# Patient Record
Sex: Male | Born: 1950 | Race: White | Hispanic: No | Marital: Married | Smoking: Never smoker
Health system: Southern US, Community
[De-identification: ages and names within clinical notes are randomized; demographics above are authoritative.]

## PROBLEM LIST (undated history)

## (undated) DIAGNOSIS — I10 Essential (primary) hypertension: Secondary | ICD-10-CM

## (undated) DIAGNOSIS — I4891 Unspecified atrial fibrillation: Secondary | ICD-10-CM

## (undated) DIAGNOSIS — Z950 Presence of cardiac pacemaker: Secondary | ICD-10-CM

## (undated) DIAGNOSIS — E785 Hyperlipidemia, unspecified: Secondary | ICD-10-CM

## (undated) DIAGNOSIS — M7662 Achilles tendinitis, left leg: Secondary | ICD-10-CM

## (undated) DIAGNOSIS — E119 Type 2 diabetes mellitus without complications: Secondary | ICD-10-CM

## (undated) DIAGNOSIS — G4730 Sleep apnea, unspecified: Secondary | ICD-10-CM

## (undated) DIAGNOSIS — G709 Myoneural disorder, unspecified: Secondary | ICD-10-CM

## (undated) DIAGNOSIS — Z9889 Other specified postprocedural states: Secondary | ICD-10-CM

## (undated) DIAGNOSIS — M7661 Achilles tendinitis, right leg: Secondary | ICD-10-CM

## (undated) HISTORY — PX: CERVICAL LAMINECTOMY: SHX94

## (undated) HISTORY — PX: ULNAR NERVE TRANSPOSITION: SHX2595

## (undated) HISTORY — PX: PACEMAKER IMPLANT: EP1218

---
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
## 2016-08-16 HISTORY — PX: AV NODE ABLATION: EP1193

## 2019-05-17 HISTORY — PX: HERNIA REPAIR: SHX51

## 2019-10-11 ENCOUNTER — Other Ambulatory Visit (HOSPITAL_COMMUNITY): Payer: Self-pay | Admitting: Orthopedic Surgery

## 2019-10-11 DIAGNOSIS — R2231 Localized swelling, mass and lump, right upper limb: Secondary | ICD-10-CM

## 2019-11-01 ENCOUNTER — Ambulatory Visit (HOSPITAL_COMMUNITY)
Admission: RE | Admit: 2019-11-01 | Discharge: 2019-11-01 | Disposition: A | Discharge status: Home / Self Care | Payer: Medicare Other | Source: Ambulatory Visit | Attending: Orthopedic Surgery | Admitting: Orthopedic Surgery

## 2019-11-01 ENCOUNTER — Other Ambulatory Visit: Payer: Self-pay

## 2019-11-01 DIAGNOSIS — R2231 Localized swelling, mass and lump, right upper limb: Secondary | ICD-10-CM | POA: Diagnosis present

## 2019-11-01 MED ORDER — GADOBUTROL 1 MMOL/ML IV SOLN
10.0000 mL | Freq: Once | INTRAVENOUS | Status: AC | PRN
  Administered 2019-11-01: 10 mL via INTRAVENOUS

## 2019-11-08 ENCOUNTER — Other Ambulatory Visit: Payer: Self-pay | Admitting: Orthopedic Surgery

## 2019-11-26 ENCOUNTER — Encounter (HOSPITAL_BASED_OUTPATIENT_CLINIC_OR_DEPARTMENT_OTHER): Payer: Self-pay | Admitting: Orthopedic Surgery

## 2019-11-26 ENCOUNTER — Other Ambulatory Visit: Payer: Self-pay

## 2019-11-28 NOTE — Progress Notes (Signed)
Pre surgery call completed.  Will need pacemaker recommendations and hold time on Eliquis prior to surgery. Called and left a message for Helmut Muster at Dr. Merrilee Seashore office about above.

## 2019-11-30 NOTE — Progress Notes (Signed)
Rceived call from Dr. Jerrell Mylar nurse, ok to hold Eliquis one day prior to surgery. Pacemaker recommendations attached to paper chart. Patient is aware and verbalized understanding.

## 2019-12-03 ENCOUNTER — Other Ambulatory Visit (HOSPITAL_COMMUNITY)
Admission: RE | Admit: 2019-12-03 | Discharge: 2019-12-03 | Disposition: A | Discharge status: Home / Self Care | Payer: Medicare Other | Source: Ambulatory Visit | Attending: Orthopedic Surgery | Admitting: Orthopedic Surgery

## 2019-12-03 ENCOUNTER — Encounter (HOSPITAL_BASED_OUTPATIENT_CLINIC_OR_DEPARTMENT_OTHER)
Admission: RE | Admit: 2019-12-03 | Discharge: 2019-12-03 | Disposition: A | Discharge status: Home / Self Care | Payer: Medicare Other | Source: Ambulatory Visit | Attending: Orthopedic Surgery | Admitting: Orthopedic Surgery

## 2019-12-03 DIAGNOSIS — Z20822 Contact with and (suspected) exposure to covid-19: Secondary | ICD-10-CM | POA: Insufficient documentation

## 2019-12-03 DIAGNOSIS — Z01812 Encounter for preprocedural laboratory examination: Secondary | ICD-10-CM | POA: Insufficient documentation

## 2019-12-03 LAB — SARS CORONAVIRUS 2 (TAT 6-24 HRS): SARS Coronavirus 2: NEGATIVE

## 2019-12-03 LAB — BASIC METABOLIC PANEL
Anion gap: 10 (ref 5–15)
BUN: 13 mg/dL (ref 8–23)
CO2: 25 mmol/L (ref 22–32)
Calcium: 9 mg/dL (ref 8.9–10.3)
Chloride: 105 mmol/L (ref 98–111)
Creatinine, Ser: 1.22 mg/dL (ref 0.61–1.24)
GFR calc Af Amer: >60 mL/min (ref >60)
GFR calc non Af Amer: >60 mL/min (ref >60)
Glucose, Bld: 113 mg/dL — ABNORMAL HIGH (ref 70–99)
Potassium: 4.2 mmol/L (ref 3.5–5.1)
Sodium: 140 mmol/L (ref 135–145)

## 2019-12-03 NOTE — Progress Notes (Signed)

## 2019-12-06 ENCOUNTER — Encounter (HOSPITAL_BASED_OUTPATIENT_CLINIC_OR_DEPARTMENT_OTHER): Payer: Self-pay | Admitting: Orthopedic Surgery

## 2019-12-06 ENCOUNTER — Ambulatory Visit (HOSPITAL_BASED_OUTPATIENT_CLINIC_OR_DEPARTMENT_OTHER): Payer: Medicare Other | Admitting: Anesthesiology

## 2019-12-06 ENCOUNTER — Ambulatory Visit (HOSPITAL_BASED_OUTPATIENT_CLINIC_OR_DEPARTMENT_OTHER)
Admission: RE | Admit: 2019-12-06 | Discharge: 2019-12-06 | Disposition: A | Discharge status: Home / Self Care | Payer: Medicare Other | Attending: Orthopedic Surgery | Admitting: Orthopedic Surgery

## 2019-12-06 ENCOUNTER — Encounter (HOSPITAL_BASED_OUTPATIENT_CLINIC_OR_DEPARTMENT_OTHER)
Admission: RE | Disposition: A | Discharge status: Home / Self Care | Payer: Self-pay | Source: Home / Self Care | Attending: Orthopedic Surgery

## 2019-12-06 ENCOUNTER — Other Ambulatory Visit: Payer: Self-pay

## 2019-12-06 DIAGNOSIS — E119 Type 2 diabetes mellitus without complications: Secondary | ICD-10-CM | POA: Diagnosis not present

## 2019-12-06 DIAGNOSIS — Z833 Family history of diabetes mellitus: Secondary | ICD-10-CM | POA: Insufficient documentation

## 2019-12-06 DIAGNOSIS — G5621 Lesion of ulnar nerve, right upper limb: Secondary | ICD-10-CM | POA: Insufficient documentation

## 2019-12-06 DIAGNOSIS — Z881 Allergy status to other antibiotic agents status: Secondary | ICD-10-CM | POA: Diagnosis not present

## 2019-12-06 DIAGNOSIS — Z888 Allergy status to other drugs, medicaments and biological substances status: Secondary | ICD-10-CM | POA: Insufficient documentation

## 2019-12-06 DIAGNOSIS — Z880 Allergy status to penicillin: Secondary | ICD-10-CM | POA: Insufficient documentation

## 2019-12-06 DIAGNOSIS — I4891 Unspecified atrial fibrillation: Secondary | ICD-10-CM | POA: Insufficient documentation

## 2019-12-06 DIAGNOSIS — I10 Essential (primary) hypertension: Secondary | ICD-10-CM | POA: Diagnosis not present

## 2019-12-06 DIAGNOSIS — Z950 Presence of cardiac pacemaker: Secondary | ICD-10-CM | POA: Insufficient documentation

## 2019-12-06 HISTORY — DX: Essential (primary) hypertension: I10

## 2019-12-06 HISTORY — DX: Achilles tendinitis, right leg: M76.61

## 2019-12-06 HISTORY — DX: Achilles tendinitis, left leg: M76.62

## 2019-12-06 HISTORY — DX: Presence of cardiac pacemaker: Z95.0

## 2019-12-06 HISTORY — PX: ULNAR NERVE TRANSPOSITION: SHX2595

## 2019-12-06 HISTORY — DX: Type 2 diabetes mellitus without complications: E11.9

## 2019-12-06 HISTORY — DX: Myoneural disorder, unspecified: G70.9

## 2019-12-06 HISTORY — DX: Unspecified atrial fibrillation: I48.91

## 2019-12-06 LAB — GLUCOSE, CAPILLARY
Glucose-Capillary: 88 mg/dL (ref 70–99)
Glucose-Capillary: 96 mg/dL (ref 70–99)

## 2019-12-06 SURGERY — ULNAR NERVE DECOMPRESSION/TRANSPOSITION
Anesthesia: Monitor Anesthesia Care | Site: Elbow | Laterality: Right

## 2019-12-06 MED ORDER — ROPIVACAINE HCL 5 MG/ML IJ SOLN
INTRAMUSCULAR | Status: DC | PRN
  Administered 2019-12-06: 20 mL via PERINEURAL

## 2019-12-06 MED ORDER — CEFAZOLIN SODIUM-DEXTROSE 2-4 GM/100ML-% IV SOLN
2.0000 g | INTRAVENOUS | Status: AC
  Administered 2019-12-06: 2 g via INTRAVENOUS

## 2019-12-06 MED ORDER — FENTANYL CITRATE (PF) 100 MCG/2ML IJ SOLN: 25.0000 ug | INTRAMUSCULAR | Status: DC | PRN

## 2019-12-06 MED ORDER — ACETAMINOPHEN 500 MG PO TABS
ORAL_TABLET | ORAL | Status: AC
  Filled 2019-12-06: qty 2

## 2019-12-06 MED ORDER — TRAMADOL HCL 50 MG PO TABS: 50.0000 mg | ORAL_TABLET | Freq: Four times a day (QID) | ORAL | 0 refills | Status: DC | PRN

## 2019-12-06 MED ORDER — OXYCODONE HCL 5 MG PO TABS
ORAL_TABLET | ORAL | Status: AC
  Filled 2019-12-06: qty 1

## 2019-12-06 MED ORDER — ACETAMINOPHEN 500 MG PO TABS
1000.0000 mg | ORAL_TABLET | Freq: Once | ORAL | Status: AC
  Administered 2019-12-06: 1000 mg via ORAL

## 2019-12-06 MED ORDER — OXYCODONE HCL 5 MG PO TABS
5.0000 mg | ORAL_TABLET | Freq: Once | ORAL | Status: AC
  Administered 2019-12-06: 12:00:00 5 mg via ORAL

## 2019-12-06 MED ORDER — MIDAZOLAM HCL 2 MG/2ML IJ SOLN
1.0000 mg | INTRAMUSCULAR | Status: DC | PRN
  Administered 2019-12-06: 2 mg via INTRAVENOUS

## 2019-12-06 MED ORDER — FENTANYL CITRATE (PF) 100 MCG/2ML IJ SOLN
50.0000 ug | INTRAMUSCULAR | Status: DC | PRN
  Administered 2019-12-06: 50 ug via INTRAVENOUS

## 2019-12-06 MED ORDER — CEFAZOLIN SODIUM-DEXTROSE 2-4 GM/100ML-% IV SOLN
INTRAVENOUS | Status: AC
  Filled 2019-12-06: qty 100

## 2019-12-06 MED ORDER — PROPOFOL 500 MG/50ML IV EMUL
INTRAVENOUS | Status: DC | PRN
  Administered 2019-12-06: 100 ug/kg/min via INTRAVENOUS

## 2019-12-06 MED ORDER — PROPOFOL 500 MG/50ML IV EMUL
INTRAVENOUS | Status: AC
  Filled 2019-12-06: qty 50

## 2019-12-06 MED ORDER — MIDAZOLAM HCL 2 MG/2ML IJ SOLN
INTRAMUSCULAR | Status: AC
  Filled 2019-12-06: qty 2

## 2019-12-06 MED ORDER — LACTATED RINGERS IV SOLN: INTRAVENOUS | Status: DC

## 2019-12-06 MED ORDER — FENTANYL CITRATE (PF) 100 MCG/2ML IJ SOLN
INTRAMUSCULAR | Status: AC
  Filled 2019-12-06: qty 2

## 2019-12-06 MED ORDER — DEXAMETHASONE SODIUM PHOSPHATE 10 MG/ML IJ SOLN
INTRAMUSCULAR | Status: DC | PRN
  Administered 2019-12-06: 5 mg

## 2019-12-06 SURGICAL SUPPLY — 48 items
BLADE MINI RND TIP GREEN BEAV (BLADE) IMPLANT
BLADE SURG 15 STRL LF DISP TIS (BLADE) ×1 IMPLANT
BLADE SURG 15 STRL SS (BLADE) ×1
BNDG COHESIVE 3X5 TAN STRL LF (GAUZE/BANDAGES/DRESSINGS) ×2 IMPLANT
BNDG ESMARK 4X9 LF (GAUZE/BANDAGES/DRESSINGS) ×2 IMPLANT
BNDG GAUZE ELAST 4 BULKY (GAUZE/BANDAGES/DRESSINGS) ×2 IMPLANT
CHLORAPREP W/TINT 26 (MISCELLANEOUS) ×2 IMPLANT
CORD BIPOLAR FORCEPS 12FT (ELECTRODE) ×2 IMPLANT
COVER BACK TABLE 60X90IN (DRAPES) ×2 IMPLANT
COVER MAYO STAND STRL (DRAPES) ×2 IMPLANT
COVER WAND RF STERILE (DRAPES) IMPLANT
CUFF TOURN SGL QUICK 18X3 (MISCELLANEOUS) ×2 IMPLANT
DECANTER SPIKE VIAL GLASS SM (MISCELLANEOUS) IMPLANT
DRAPE EXTREMITY T 121X128X90 (DISPOSABLE) ×2 IMPLANT
DRAPE SURG 17X23 STRL (DRAPES) ×2 IMPLANT
DRSG PAD ABDOMINAL 8X10 ST (GAUZE/BANDAGES/DRESSINGS) ×2 IMPLANT
GAUZE 4X4 16PLY RFD (DISPOSABLE) IMPLANT
GAUZE SPONGE 4X4 12PLY STRL (GAUZE/BANDAGES/DRESSINGS) ×2 IMPLANT
GAUZE XEROFORM 1X8 LF (GAUZE/BANDAGES/DRESSINGS) ×2 IMPLANT
GLOVE BIOGEL PI IND STRL 6.5 (GLOVE) ×2 IMPLANT
GLOVE BIOGEL PI IND STRL 8.5 (GLOVE) ×1 IMPLANT
GLOVE BIOGEL PI INDICATOR 6.5 (GLOVE) ×2
GLOVE BIOGEL PI INDICATOR 8.5 (GLOVE) ×1
GLOVE SURG ORTHO 8.0 STRL STRW (GLOVE) ×2 IMPLANT
GOWN STRL REUS W/ TWL LRG LVL3 (GOWN DISPOSABLE) ×1 IMPLANT
GOWN STRL REUS W/TWL LRG LVL3 (GOWN DISPOSABLE) ×1
GOWN STRL REUS W/TWL XL LVL3 (GOWN DISPOSABLE) ×4 IMPLANT
LOOP VESSEL MAXI BLUE (MISCELLANEOUS) IMPLANT
NEEDLE PRECISIONGLIDE 27X1.5 (NEEDLE) IMPLANT
NS IRRIG 1000ML POUR BTL (IV SOLUTION) ×2 IMPLANT
PAD CAST 3X4 CTTN HI CHSV (CAST SUPPLIES) IMPLANT
PAD CAST 4YDX4 CTTN HI CHSV (CAST SUPPLIES) IMPLANT
PADDING CAST COTTON 3X4 STRL (CAST SUPPLIES)
PADDING CAST COTTON 4X4 STRL (CAST SUPPLIES)
SET BASIN DAY SURGERY F.S. (CUSTOM PROCEDURE TRAY) ×2 IMPLANT
SLEEVE SCD COMPRESS KNEE MED (MISCELLANEOUS) IMPLANT
SLING ARM FOAM STRAP LRG (SOFTGOODS) ×2 IMPLANT
SPLINT PLASTER CAST XFAST 3X15 (CAST SUPPLIES) IMPLANT
SPLINT PLASTER XTRA FASTSET 3X (CAST SUPPLIES)
STOCKINETTE 4X48 STRL (DRAPES) ×2 IMPLANT
SUT ETHILON 4 0 PS 2 18 (SUTURE) ×2 IMPLANT
SUT VIC AB 2-0 SH 27 (SUTURE) ×1
SUT VIC AB 2-0 SH 27XBRD (SUTURE) ×1 IMPLANT
SUT VICRYL 4-0 PS2 18IN ABS (SUTURE) ×2 IMPLANT
SYR BULB EAR ULCER 3OZ GRN STR (SYRINGE) ×2 IMPLANT
SYR CONTROL 10ML LL (SYRINGE) IMPLANT
TOWEL GREEN STERILE FF (TOWEL DISPOSABLE) ×2 IMPLANT
UNDERPAD 30X36 HEAVY ABSORB (UNDERPADS AND DIAPERS) ×2 IMPLANT

## 2019-12-06 NOTE — Anesthesia Postprocedure Evaluation (Signed)
Anesthesia Post Note  Patient: Chad Pratt  Procedure(s) Performed: ULNAR NERVE DECOMPRESSION/ (Right Elbow)     Patient location during evaluation: PACU Anesthesia Type: MAC and Regional Level of consciousness: awake and alert Pain management: pain level controlled Vital Signs Assessment: post-procedure vital signs reviewed and stable Respiratory status: spontaneous breathing, nonlabored ventilation, respiratory function stable and patient connected to nasal cannula oxygen Cardiovascular status: stable and blood pressure returned to baseline Postop Assessment: no apparent nausea or vomiting Anesthetic complications: no    Last Vitals:  Vitals:   12/06/19 1115 12/06/19 1130  BP: (!) 146/81 (!) 159/97  Pulse: 65 (!) 51  Resp: 13 16  Temp:  37.2 C  SpO2: 96% 99%    Last Pain:  Vitals:   12/06/19 1130  TempSrc:   PainSc: 2                  Riva Sesma L Kimberla Driskill

## 2019-12-06 NOTE — Brief Op Note (Signed)
12/06/2019  10:31 AM  PATIENT:  Chad Pratt  69 y.o. male  PRE-OPERATIVE DIAGNOSIS:  CUBITAL TUNNEL RIGHT ELBOW  POST-OPERATIVE DIAGNOSIS:  CUBITAL TUNNEL RIGHT ELBOW  PROCEDURE:  Procedure(s) with comments: ULNAR NERVE DECOMPRESSION/ (Right) - AXILLARY  SURGEON:  Surgeon(s) and Role:    * Cindee Salt, MD - Primary    * Betha Loa, MD - Assisting  PHYSICIAN ASSISTANT:   ASSISTANTS: K Yoseline Andersson<md   ANESTHESIA:   General  EBL:  51ml  BLOOD ADMINISTERED:none  DRAINS: none   LOCAL MEDICATIONS USED:  NONE  SPECIMEN:  No Specimen  DISPOSITION OF SPECIMEN:  N/A  COUNTS:  YES  TOURNIQUET:   Total Tourniquet Time Documented: Upper Arm (Right) - 18 minutes Total: Upper Arm (Right) - 18 minutes   DICTATION: .Dragon Dictation  PLAN OF CARE: Discharge to home after PACU  PATIENT DISPOSITION:  PACU - hemodynamically stable.

## 2019-12-06 NOTE — Anesthesia Preprocedure Evaluation (Addendum)
Anesthesia Evaluation  Patient identified by MRN, date of birth, ID band Patient awake    Reviewed: Allergy & Precautions, NPO status , Patient's Chart, lab work & pertinent test results, reviewed documented beta blocker date and time   Airway Mallampati: II  TM Distance: >3 FB Neck ROM: Full    Dental no notable dental hx. (+) Teeth Intact, Dental Advisory Given   Pulmonary neg pulmonary ROS,    Pulmonary exam normal breath sounds clear to auscultation       Cardiovascular hypertension, Pt. on home beta blockers and Pt. on medications Normal cardiovascular exam+ dysrhythmias Atrial Fibrillation + pacemaker (for bradycardia)  Rhythm:Regular Rate:Normal  TTE 08/2019 Mild concentric left ventricular hypertrophy. Normal left ventricular size and systolic function with no appreciable segmental abnormality. Ejection fraction is visually estimated at 65%. Diastolic function is normal for age.   Neuro/Psych negative neurological ROS  negative psych ROS   GI/Hepatic negative GI ROS, Neg liver ROS,   Endo/Other  negative endocrine ROSdiabetes, Type 2, Oral Hypoglycemic Agents  Renal/GU negative Renal ROS  negative genitourinary   Musculoskeletal negative musculoskeletal ROS (+)   Abdominal   Peds  Hematology  (+) Blood dyscrasia (on eliquis), ,   Anesthesia Other Findings   Reproductive/Obstetrics                            Anesthesia Physical Anesthesia Plan  ASA: III  Anesthesia Plan: MAC and Regional   Post-op Pain Management:    Induction: Intravenous  PONV Risk Score and Plan: 1 and Propofol infusion, Treatment may vary due to age or medical condition and Midazolam  Airway Management Planned: Natural Airway  Additional Equipment:   Intra-op Plan:   Post-operative Plan:   Informed Consent: I have reviewed the patients History and Physical, chart, labs and discussed the procedure  including the risks, benefits and alternatives for the proposed anesthesia with the patient or authorized representative who has indicated his/her understanding and acceptance.     Dental advisory given  Plan Discussed with: CRNA  Anesthesia Plan Comments:         Anesthesia Quick Evaluation

## 2019-12-06 NOTE — H&P (Signed)
Chad Pratt is an 69 y.o. male.   Chief Complaint:numbness HPI: Chad Pratt is a 69 year old right-hand-dominant male who comes in complaining of numbness and tingling in the right ring and small finger. This has been present for approximately 6 months He had a hernia repair has been sleeping in a recliner note has noted numbness and tingling of ring and small finger right hand. He states he is going to bed with the hopes that would improve this. He states it has not significantly changed. Has no history of injury. The has had a cubital tunnel operation in the past done by Dr. Merril Abbe in 2008. He is also had cervical spinal laminectomy 5 through 718 years ago. He has not tried taking anything for this. He has no history of new injury. He has a history of diabetes arthritis questionable gout. Thyroid problems negative. Family history is positive diabetes thyroid problems arthritis negative for gout. States nothing seems to make it better or worse for him.Nerve conductions which revealed a subacute right ulnar neuropathy not localizable to the right side.  He had nerve conductions along with an ultrasound done at that time. Is a questionable mass along the ulnar nerve on ultrasound. He was referred for MRI for confirmation. The MRI has been done and read out by Dr. Pollyann Kennedy. This reveals no mass. Does reveal that the ulnar nerve is thickened in the cubital tunnel indicative of neuropathy there. He does have positive nerve conductions continues to complain of the decreased sensibility in pain along the ulnar nerve distribution to palpation. Nerve conductions done by Dr. Tamsen Roers showed a sensory component neuropathy only.   Past Medical History:    Past Medical History:  Diagnosis Date  . A-fib ([place])   . Diabetes mellitus without complication ([place])   . Hypertension   . Neuromuscular disorder ([place])   . 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 Social History:  has no history on file for tobacco, alcohol, and drug.  Allergies:  Allergies  Allergen Reactions  . Clindamycin/Lincomycin   . Penicillins   . Meloxicam Rash    No medications prior to admission.    No results found for this or any previous visit (from the past 48 hour(s)).  No results found.   Pertinent items are noted in HPI.  There were no vitals taken for this visit.  General appearance: alert, cooperative and appears stated age Head: Normocephalic, without obvious abnormality Neck: no JVD Resp: clear to auscultation bilaterally Cardio: regular rate and rhythm, S1, S2 normal, no murmur, click, rub or gallop GI: soft, non-tender; bowel sounds normal; no masses,  no organomegaly Extremities: no edema, redness or tenderness in the calves or thighs, no ulcers, gangrene or trophic changes and Numbness right hand little right Pulses: 2+ and symmetric Skin: Skin color, texture, turgor normal. No rashes or lesions Neurologic: Grossly normal Incision/Wound: na  Assessment/Plan  Diagnosis cubital tunnel syndrome right arm   Plan: Have discussed that the erythema in his arm is not coming from the ulnar nerve per se. The tenderness to palpation along the nerve may be the neuropathy. He would like to proceed to have this decompressed with possible transposition. Preperi-and postoperative course been discussed along with risk and complications. He is aware there is no guarantee to the surgery the possibility of infection recurrence injury to arteries nerves tendons incomplete relief of symptoms and dystrophy. He is advised that there is could possibly  also be contributed to his problem from his neck. He is scheduled for decompression possible transposition right ulnar nerve as an outpatient under regional anesthesia.  Cindee Salt 12/06/2019, 5:39 AM

## 2019-12-06 NOTE — Op Note (Signed)
NAME: Chad Pratt MEDICAL RECORD NO: [id number] DATE OF BIRTH: 07-29-1951 FACILITY: [facility] LOCATION: [facility] PHYSICIAN: Nicki Reaper, MD   OPERATIVE REPORT   DATE OF PROCEDURE: 12/06/19    PREOPERATIVE DIAGNOSIS:   Cubital tunnel syndrome right arm   POSTOPERATIVE DIAGNOSIS:   Same   PROCEDURE:   Decompression ulnar nerve right elbow   SURGEON: Cindee Salt, M.D.   ASSISTANT: Betha Loa, MD   ANESTHESIA:  Regional with sedation   INTRAVENOUS FLUIDS:  Per anesthesia flow sheet.   ESTIMATED BLOOD LOSS:  Minimal.   COMPLICATIONS:  None.   SPECIMENS:  none   TOURNIQUET TIME:    Total Tourniquet Time Documented: Upper Arm (Right) - 18 minutes Total: Upper Arm (Right) - 18 minutes    DISPOSITION:  Stable to PACU.   INDICATIONS: Patient is a 69 year old male with a history of numbness and tingling ring and little finger right hand.  Nerve conductions are positive for cubital tunnel syndrome.  He has had a cubital tunnel decompression on his opposite side in the past.  He is overtly aware of risks and complications including infection recurrence injury to arteries nerves tendons complete release symptoms dystrophy the possibility of holding the process and allowing the nerve to get better but not guaranteeing that it will.  In preoperative area the patient seen the extremity marked by both patient patient and surgeon antibiotic given procedure.  The arm was blocked under the direction of the anesthesia department.  OPERATIVE COURSE: Patient is brought to the operating room placed in a supine position with the right arm free.  He was prepped with ChloraPrep a 3-minute dry time allowed and timeout taken confirming patient procedure.  The limb was exsanguinated with an Esmarch bandage turn placed on the arm was inflated to 250 mmHg a longitudinal incision was made just posterior to the medial epicondyle right elbow carried down through subcutaneous tissue.   Neurovascular structures identified protected the dissection carried down to Osborne's fascia which was incised on its posterior aspect.  The ulnar nerve was identified.  The subcutaneous tissue and skin was dissected free from the flexor carpi ulnaris distally after placement of a knee retractor for retraction.  The superficial fascia was then released with blunt and sharp dissection.  The muscle belly was then split.  A KMIguide for carpal tunnel release was then placed between the ulnar nerve distally and the deep fascia and this was released using angled ENT scissors for approximately 6 8 cm distal to the incision.  Attention was then directed proximally.  The brachial fascia was then dissected free from the underlying fascia.  The [facility] guide was then placed between the ulnar nerve proximally and the proximal brachial fascia was then released after placement of the knee retractor proximally.  The elbow was fully flexed no subluxation was noted.  The wound was copiously irrigated with saline.  The Osborne's fascia was then sutured to the posterior skin flap with 2-0 Vicryl sutures the subcutaneous tissue was closed with interrupted 4-0 Vicryl and skin with interrupted 4-0 nylon sutures.  A sterile compressive dressing was applied.  Deflation of the tourniquet all fingers immediately pink.  He was taken to the recovery room for observation in satisfactory condition.  He will be discharged home to return to [facility] in 1 week on Tylenol and Ultram for discomfort.   Cindee Salt, MD Electronically signed, 12/06/19

## 2019-12-06 NOTE — Progress Notes (Signed)
Assisted Dr. Woodrum with right, ultrasound guided, supraclavicular block. Side rails up, monitors on throughout procedure. See vital signs in flow sheet. Tolerated Procedure well. 

## 2019-12-06 NOTE — Discharge Instructions (Addendum)
[facility] Center Instructions Hand Surgery  Wound Care: Keep your hand elevated above the level of your heart.  Do not allow it to dangle by your side.  Keep the dressing dry and do not remove it unless your doctor advises you to do so.  He will usually change it at the time of your post-op visit.  Moving your fingers is advised to stimulate circulation but will depend on the site of your surgery.  If you have a splint applied, your doctor will advise you regarding movement.  Activity: Do not drive or operate machinery today.  Rest today and then you may return to your normal activity and work as indicated by your physician.  Diet:  Drink liquids today or eat a light diet.  You may resume a regular diet tomorrow.    General expectations: Pain for two to three days. Fingers may become slightly swollen.  Call your doctor if any of the following occur: Severe pain not relieved by pain medication. Elevated temperature. Dressing soaked with blood. Inability to move fingers. White or bluish color to fingers.    Post Anesthesia Home Care Instructions  Activity: Get plenty of rest for the remainder of the day. A responsible individual must stay with you for 24 hours following the procedure.  For the next 24 hours, DO NOT: -Drive a car -Advertising copywriter -Drink alcoholic beverages -Take any medication unless instructed by your physician -Make any legal decisions or sign important papers.  Meals: Start with liquid foods such as gelatin or soup. Progress to regular foods as tolerated. Avoid greasy, spicy, heavy foods. If nausea and/or vomiting occur, drink only clear liquids until the nausea and/or vomiting subsides. Call your physician if vomiting continues.  Special Instructions/Symptoms: Your throat may feel dry or sore from the anesthesia or the breathing tube placed in your throat during surgery. If this causes discomfort, gargle with warm salt water. The discomfort should disappear  within 24 hours.  If you had a scopolamine patch placed behind your ear for the management of post- operative nausea and/or vomiting:  1. The medication in the patch is effective for 72 hours, after which it should be removed.  Wrap patch in a tissue and discard in the trash. Wash hands thoroughly with soap and water. 2. You may remove the patch earlier than 72 hours if you experience unpleasant side effects which may include dry mouth, dizziness or visual disturbances. 3. Avoid touching the patch. Wash your hands with soap and water after contact with the patch.   No tylenol until after 3pm today.  Regional Anesthesia Blocks  1. Numbness or the inability to move the "blocked" extremity may last from 3-48 hours after placement. The length of time depends on the medication injected and your individual response to the medication. If the numbness is not going away after 48 hours, call your surgeon.  2. The extremity that is blocked will need to be protected until the numbness is gone and the  Strength has returned. Because you cannot feel it, you will need to take extra care to avoid injury. Because it may be weak, you may have difficulty moving it or using it. You may not know what position it is in without looking at it while the block is in effect.  3. For blocks in the legs and feet, returning to weight bearing and walking needs to be done carefully. You will need to wait until the numbness is entirely gone and the strength has returned. You  should be able to move your leg and foot normally before you try and bear weight or walk. You will need someone to be with you when you first try to ensure you do not fall and possibly risk injury.  4. Bruising and tenderness at the needle site are common side effects and will resolve in a few days.  5. Persistent numbness or new problems with movement should be communicated to the surgeon or the [place] [phone] [place] [phone]).

## 2019-12-06 NOTE — Op Note (Addendum)
I assisted Surgeon(s) and Role:    * Cindee Salt, MD - Primary    * Betha Loa, MD - Assisting on the Procedure(s): ULNAR NERVE DECOMPRESSION/ on 12/06/2019.  I provided assistance on this case as follows: retraction soft tissues, positioning of arm.  Electronically signed by: Betha Loa, MD Date: 12/06/2019 Time: 10:29 AM

## 2019-12-06 NOTE — Transfer of Care (Signed)
Immediate Anesthesia Transfer of Care Note  Patient: Chad Pratt  Procedure(s) Performed: ULNAR NERVE DECOMPRESSION/ (Right Elbow)  Patient Location: PACU  Anesthesia Type:MAC combined with regional for post-op pain  Level of Consciousness: awake, alert  and oriented  Airway & Oxygen Therapy: Patient Spontanous Breathing and Patient connected to face mask oxygen  Post-op Assessment: Report given to RN and Post -op Vital signs reviewed and stable  Post vital signs: Reviewed and stable  Last Vitals:  Vitals Value Taken Time  BP 115/66 12/06/19 1030  Temp    Pulse 51 12/06/19 1032  Resp 18 12/06/19 1032  SpO2 99 % 12/06/19 1032  Vitals shown include unvalidated device data.  Last Pain:  Vitals:   12/06/19 0840  TempSrc: Oral  PainSc: 2       Patients Stated Pain Goal: 2 (31/12/16 2446)  Complications: No apparent anesthesia complications

## 2019-12-06 NOTE — Anesthesia Procedure Notes (Signed)
Anesthesia Regional Block: Supraclavicular block   Pre-Anesthetic Checklist: ,, timeout performed, Correct Patient, Correct Site, Correct Laterality, Correct Procedure, Correct Position, site marked, Risks and benefits discussed,  Surgical consent,  Pre-op evaluation,  At surgeon's request and post-op pain management  Laterality: Right  Prep: Maximum Sterile Barrier Precautions used, chloraprep       Needles:  Injection technique: Single-shot  Needle Type: Echogenic Stimulator Needle     Needle Length: 9cm  Needle Gauge: 22     Additional Needles:   Procedures:,,,, ultrasound used (permanent image in chart),,,,  Narrative:  Start time: 12/06/2019 9:17 AM End time: 12/06/2019 9:27 AM Injection made incrementally with aspirations every 5 mL.  Performed by: Personally  Anesthesiologist: Elmer Picker, MD  Additional Notes: Monitors applied. No increased pain on injection. No increased resistance to injection. Injection made in 5cc increments. Good needle visualization. Patient tolerated procedure well.

## 2019-12-07 ENCOUNTER — Encounter: Payer: Self-pay | Admitting: *Deleted

## 2020-11-19 IMAGING — MR MR ELBOW*R* WO/W CM
9 of 11 series · 38 of 40 positions shown · IV contrast (gadavist)
Comparison: None.

CLINICAL DATA: Numbness in the right ring and little fingers for 5
months. Abnormal nerve conduction study.

EXAM:
MRI OF THE RIGHT ELBOW WITHOUT AND WITH CONTRAST
TECHNIQUE: Multiplanar, multisequence MR imaging of the elbow was performed
before and after the administration of intravenous contrast.
CONTRAST:  10 mL GADAVIST IV SOLN

[Series 10: T1 · axial · right · 3.0mm · 0.45mm/px · z∈[-4,+60]mm · 4 of 20 slices shown (1 of 2)]
[im 1/20]
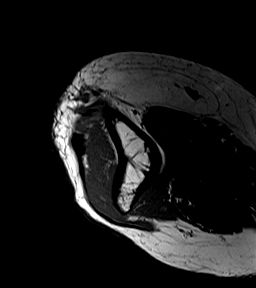
[im 7/20]
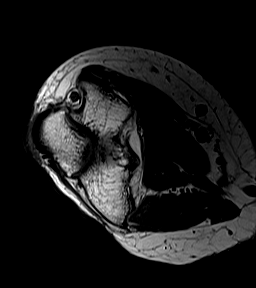
[im 13/20]
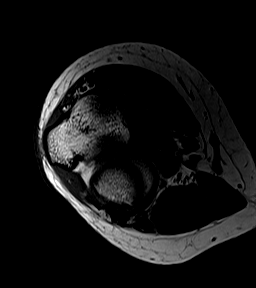
[im 20/20]
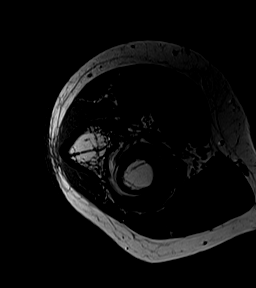

[Series 12: T1 · sagittal · right · 3.0mm · 0.38mm/px · 4 of 21 slices shown (2 of 2)]
[im 1/21]
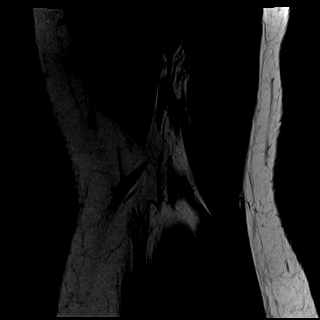
[im 7/21]
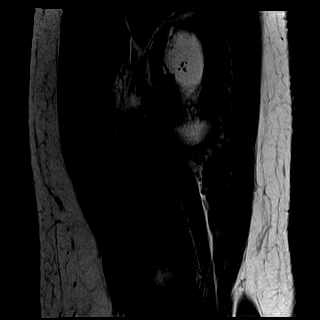
[im 14/21]
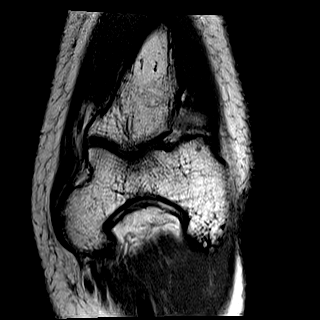
[im 21/21]
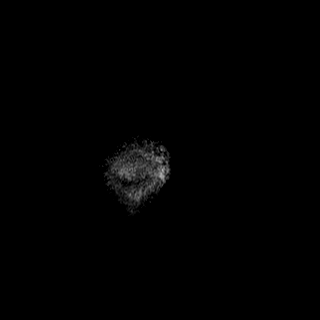

[Series 13: ax (id) · axial · right · 3.0mm · 0.62mm/px · z∈[-1,+63]mm · 4 of 20 slices shown]
[im 1/20]
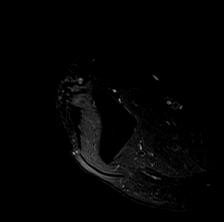
[im 7/20]
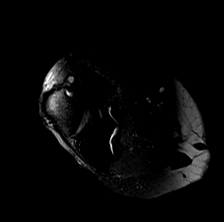
[im 13/20]
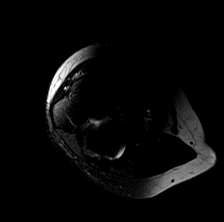
[im 20/20]
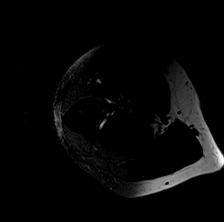

[Series 14: T2 fat-sat · sagittal · right · 3.0mm · 0.42mm/px · 4 of 20 slices shown]
[im 1/20]
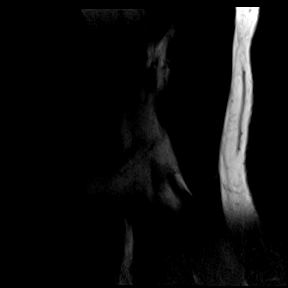
[im 7/20]
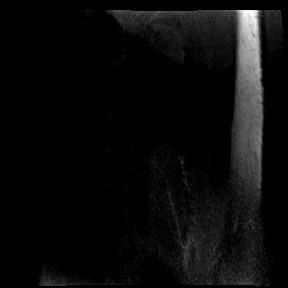
[im 13/20]
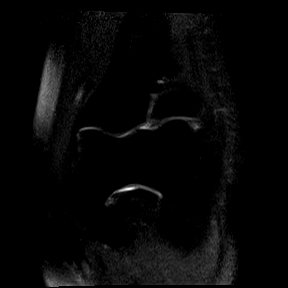
[im 20/20]
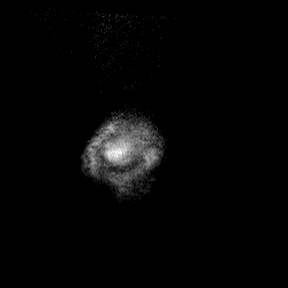

[Series 15: sag pd_in · coronal · right · 3.0mm · 0.47mm/px · 5 of 25 slices shown]
[im 1/25]
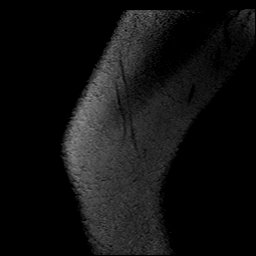
[im 7/25]
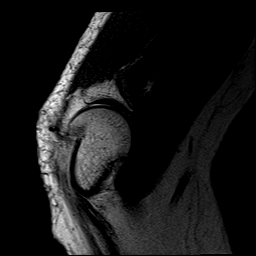
[im 13/25]
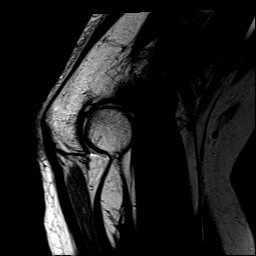
[im 19/25]
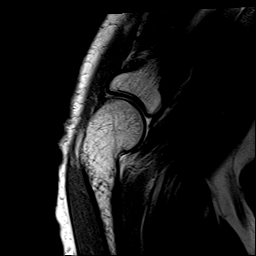
[im 25/25]
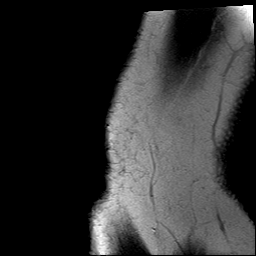

[Series 16: sag pd_w · coronal · right · 3.0mm · 0.47mm/px · 5 of 25 slices shown]
[im 1/25]
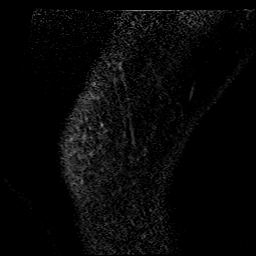
[im 7/25]
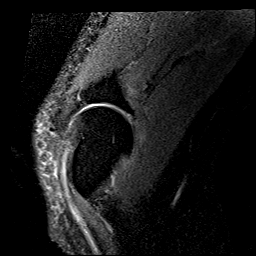
[im 13/25]
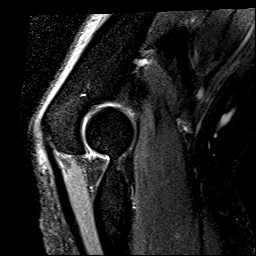
[im 19/25]
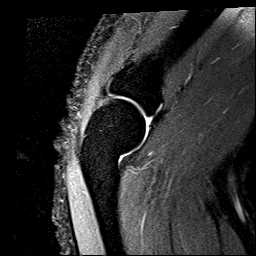
[im 25/25]
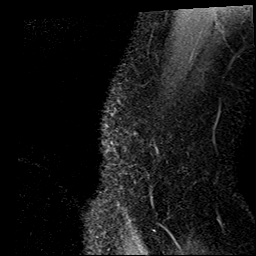

[Series 18: DIXON · axial · right · 3.0mm · 0.42mm/px · z∈[-1,+63]mm · 4 of 20 slices shown (1 of 3)]
[im 1/20]
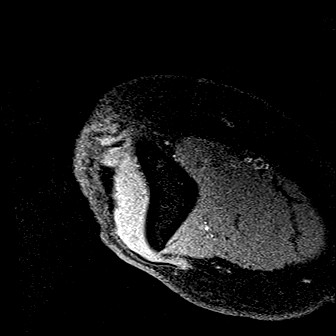
[im 7/20]
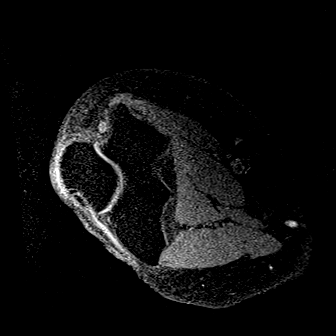
[im 13/20]
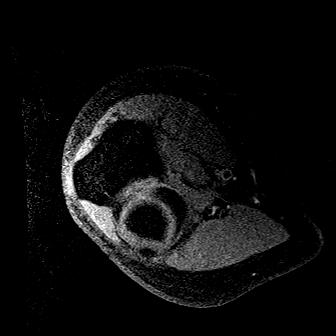
[im 20/20]
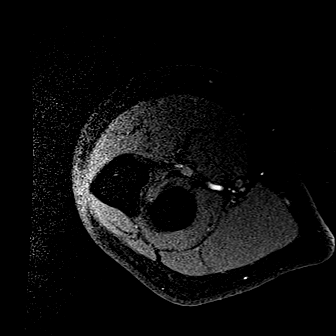

[Series 20: DIXON · axial · right · 3.0mm · 0.42mm/px · z∈[-1,+63]mm · 4 of 20 slices shown (2 of 3)]
[im 1/20]
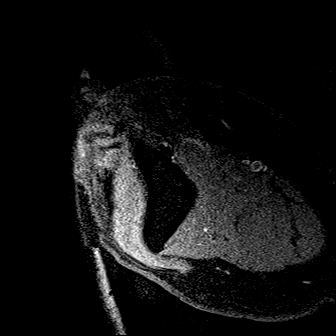
[im 7/20]
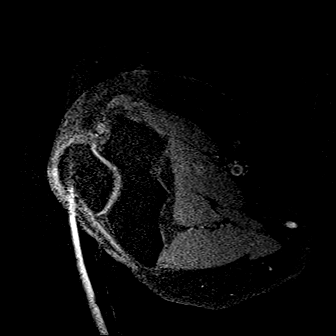
[im 13/20]
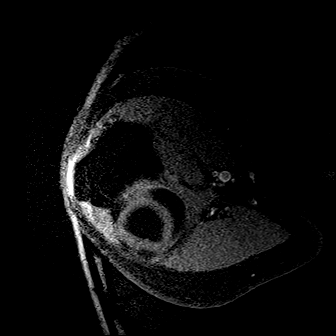
[im 20/20]
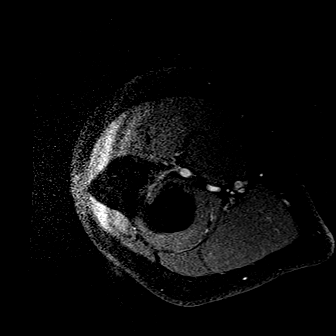

[Series 22: DIXON · sagittal · right · 3.0mm · 0.44mm/px · 4 of 21 slices shown (3 of 3)]
[im 1/21]
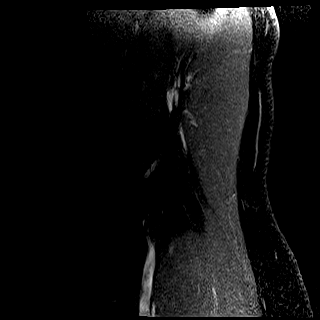
[im 7/21]
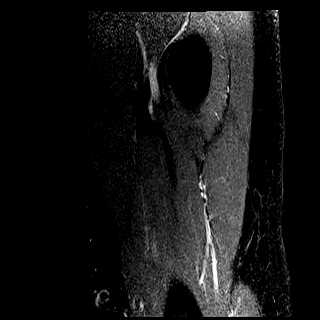
[im 14/21]
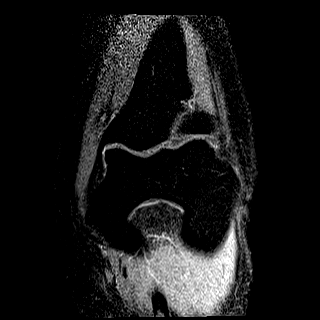
[im 21/21]
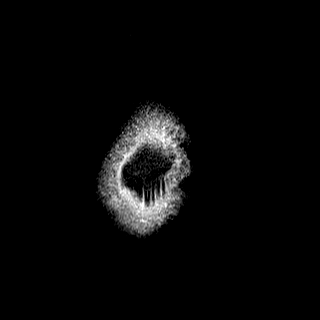

[38 of 40 positions shown; findings below may reference images not displayed]

FINDINGS: TENDONS

Common forearm flexor origin: Intact.

Common forearm extensor origin: Intact.

Biceps: Intact.

Triceps: Intact.

LIGAMENTS

Medial stabilizers: Intact.

Lateral stabilizers:  Intact.

Cartilage: Normal.

Joint: No effusion.

Cubital tunnel: No nerve sheath tumor is identified. No impingement
on the nerve is seen. The nerve is mildly thickened with increased
T2 signal in the cubital tunnel.

Bones: Normal signal throughout.
IMPRESSION: Negative for nerve sheath tumor or nerve impingement. The ulnar
nerve is mildly thickened with increased T2 signal in the cubital
tunnel suggestive of ulnar neuropathy. The exam is otherwise
negative.

## 2023-09-29 ENCOUNTER — Other Ambulatory Visit: Payer: Self-pay | Admitting: Orthopedic Surgery

## 2023-10-26 ENCOUNTER — Encounter (HOSPITAL_BASED_OUTPATIENT_CLINIC_OR_DEPARTMENT_OTHER): Payer: Self-pay | Admitting: Orthopedic Surgery

## 2023-10-26 ENCOUNTER — Other Ambulatory Visit: Payer: Self-pay

## 2023-10-27 ENCOUNTER — Other Ambulatory Visit: Payer: Self-pay

## 2023-10-27 ENCOUNTER — Encounter (HOSPITAL_BASED_OUTPATIENT_CLINIC_OR_DEPARTMENT_OTHER)
Admission: RE | Admit: 2023-10-27 | Discharge: 2023-10-27 | Disposition: A | Discharge status: Home / Self Care | Source: Ambulatory Visit | Attending: Orthopedic Surgery | Admitting: Orthopedic Surgery

## 2023-10-27 DIAGNOSIS — I4510 Unspecified right bundle-branch block: Secondary | ICD-10-CM | POA: Insufficient documentation

## 2023-10-27 DIAGNOSIS — I10 Essential (primary) hypertension: Secondary | ICD-10-CM | POA: Insufficient documentation

## 2023-10-27 DIAGNOSIS — R001 Bradycardia, unspecified: Secondary | ICD-10-CM | POA: Diagnosis not present

## 2023-10-27 DIAGNOSIS — Z01818 Encounter for other preprocedural examination: Secondary | ICD-10-CM | POA: Insufficient documentation

## 2023-10-27 DIAGNOSIS — E119 Type 2 diabetes mellitus without complications: Secondary | ICD-10-CM | POA: Diagnosis not present

## 2023-10-27 LAB — BASIC METABOLIC PANEL
Anion gap: 7 (ref 5–15)
BUN: 20 mg/dL (ref 8–23)
CO2: 25 mmol/L (ref 22–32)
Calcium: 9 mg/dL (ref 8.9–10.3)
Chloride: 105 mmol/L (ref 98–111)
Creatinine, Ser: 1.39 mg/dL — ABNORMAL HIGH (ref 0.61–1.24)
GFR, Estimated: 54 mL/min — ABNORMAL LOW (ref >60)
Glucose, Bld: 109 mg/dL — ABNORMAL HIGH (ref 70–99)
Potassium: 4 mmol/L (ref 3.5–5.1)
Sodium: 137 mmol/L (ref 135–145)

## 2023-11-02 NOTE — Anesthesia Preprocedure Evaluation (Signed)
 Anesthesia Evaluation  Patient identified by MRN, date of birth, ID band Patient awake    Reviewed: Allergy & Precautions, NPO status , Patient's Chart, lab work & pertinent test results  History of Anesthesia Complications (+) PONV and history of anesthetic complications  Airway Mallampati: II  TM Distance: >3 FB Neck ROM: Full    Dental no notable dental hx. (+) Teeth Intact, Dental Advisory Given   Pulmonary sleep apnea    Pulmonary exam normal breath sounds clear to auscultation       Cardiovascular hypertension, Pt. on medications (-) angina (-) Past MI Normal cardiovascular exam+ dysrhythmias (on eliquis) + pacemaker  Rhythm:Regular Rate:Normal     Neuro/Psych  Neuromuscular disease  negative psych ROS   GI/Hepatic negative GI ROS, Neg liver ROS,,,  Endo/Other  diabetes, Type 2, Oral Hypoglycemic Agents    Renal/GU negative Renal ROS     Musculoskeletal   Abdominal   Peds  Hematology   Anesthesia Other Findings All: clindamycin, tramadol, meloxicam, pcn  Reproductive/Obstetrics                             Anesthesia Physical Anesthesia Plan  ASA: 3  Anesthesia Plan: Regional   Post-op Pain Management: Regional block* and Minimal or no pain anticipated   Induction: Intravenous  PONV Risk Score and Plan: Treatment may vary due to age or medical condition, Midazolam, Ondansetron and Propofol infusion  Airway Management Planned: Nasal Cannula and Natural Airway  Additional Equipment: None  Intra-op Plan:   Post-operative Plan:   Informed Consent: I have reviewed the patients History and Physical, chart, labs and discussed the procedure including the risks, benefits and alternatives for the proposed anesthesia with the patient or authorized representative who has indicated his/her understanding and acceptance.     Dental advisory given  Plan Discussed with: CRNA and  Surgeon  Anesthesia Plan Comments:         Anesthesia Quick Evaluation

## 2023-11-03 ENCOUNTER — Encounter (HOSPITAL_BASED_OUTPATIENT_CLINIC_OR_DEPARTMENT_OTHER): Payer: Self-pay | Admitting: Orthopedic Surgery

## 2023-11-03 ENCOUNTER — Other Ambulatory Visit: Payer: Self-pay

## 2023-11-03 ENCOUNTER — Ambulatory Visit (HOSPITAL_BASED_OUTPATIENT_CLINIC_OR_DEPARTMENT_OTHER)
Admission: RE | Admit: 2023-11-03 | Discharge: 2023-11-03 | Disposition: A | Discharge status: Home / Self Care | Payer: 59 | Attending: Orthopedic Surgery | Admitting: Orthopedic Surgery

## 2023-11-03 ENCOUNTER — Encounter (HOSPITAL_BASED_OUTPATIENT_CLINIC_OR_DEPARTMENT_OTHER)
Admission: RE | Disposition: A | Discharge status: Home / Self Care | Payer: Self-pay | Source: Home / Self Care | Attending: Orthopedic Surgery

## 2023-11-03 ENCOUNTER — Ambulatory Visit (HOSPITAL_BASED_OUTPATIENT_CLINIC_OR_DEPARTMENT_OTHER): Payer: Self-pay | Admitting: Anesthesiology

## 2023-11-03 DIAGNOSIS — E119 Type 2 diabetes mellitus without complications: Secondary | ICD-10-CM

## 2023-11-03 DIAGNOSIS — Z7984 Long term (current) use of oral hypoglycemic drugs: Secondary | ICD-10-CM | POA: Diagnosis not present

## 2023-11-03 DIAGNOSIS — G5622 Lesion of ulnar nerve, left upper limb: Secondary | ICD-10-CM | POA: Diagnosis not present

## 2023-11-03 DIAGNOSIS — I10 Essential (primary) hypertension: Secondary | ICD-10-CM

## 2023-11-03 DIAGNOSIS — Z79899 Other long term (current) drug therapy: Secondary | ICD-10-CM | POA: Diagnosis not present

## 2023-11-03 DIAGNOSIS — G4730 Sleep apnea, unspecified: Secondary | ICD-10-CM | POA: Diagnosis not present

## 2023-11-03 DIAGNOSIS — E1141 Type 2 diabetes mellitus with diabetic mononeuropathy: Secondary | ICD-10-CM | POA: Insufficient documentation

## 2023-11-03 DIAGNOSIS — G5602 Carpal tunnel syndrome, left upper limb: Secondary | ICD-10-CM

## 2023-11-03 HISTORY — DX: Other specified postprocedural states: Z98.890

## 2023-11-03 HISTORY — PX: CARPAL TUNNEL RELEASE: SHX101

## 2023-11-03 HISTORY — PX: ULNAR NERVE TRANSPOSITION: SHX2595

## 2023-11-03 HISTORY — DX: Sleep apnea, unspecified: G47.30

## 2023-11-03 HISTORY — DX: Hyperlipidemia, unspecified: E78.5

## 2023-11-03 LAB — GLUCOSE, CAPILLARY
Glucose-Capillary: 97 mg/dL (ref 70–99)
Glucose-Capillary: 100 mg/dL — ABNORMAL HIGH (ref 70–99)

## 2023-11-03 SURGERY — CARPAL TUNNEL RELEASE
Anesthesia: Regional | Site: Wrist | Laterality: Left

## 2023-11-03 MED ORDER — CEFAZOLIN SODIUM-DEXTROSE 2-4 GM/100ML-% IV SOLN
INTRAVENOUS | Status: AC
  Filled 2023-11-03: qty 100

## 2023-11-03 MED ORDER — HYDROCODONE-ACETAMINOPHEN 5-325 MG PO TABS: ORAL_TABLET | ORAL | 0 refills | Status: AC

## 2023-11-03 MED ORDER — ONDANSETRON HCL 4 MG/2ML IJ SOLN
INTRAMUSCULAR | Status: AC
Start: 2023-11-03 — End: ?
  Filled 2023-11-03: qty 2

## 2023-11-03 MED ORDER — CEFAZOLIN SODIUM-DEXTROSE 3-4 GM/150ML-% IV SOLN
3.0000 g | INTRAVENOUS | Status: AC
  Administered 2023-11-03: 3 g via INTRAVENOUS

## 2023-11-03 MED ORDER — PROPOFOL 10 MG/ML IV BOLUS
INTRAVENOUS | Status: DC | PRN
  Administered 2023-11-03: 100 ug/kg/min via INTRAVENOUS

## 2023-11-03 MED ORDER — MIDAZOLAM HCL 2 MG/2ML IJ SOLN
2.0000 mg | Freq: Once | INTRAMUSCULAR | Status: AC
  Administered 2023-11-03: 2 mg via INTRAVENOUS

## 2023-11-03 MED ORDER — LACTATED RINGERS IV SOLN: INTRAVENOUS | Status: DC

## 2023-11-03 MED ORDER — FENTANYL CITRATE (PF) 100 MCG/2ML IJ SOLN
50.0000 ug | Freq: Once | INTRAMUSCULAR | Status: AC
  Administered 2023-11-03: 50 ug via INTRAVENOUS

## 2023-11-03 MED ORDER — FENTANYL CITRATE (PF) 100 MCG/2ML IJ SOLN
INTRAMUSCULAR | Status: AC
  Filled 2023-11-03: qty 2

## 2023-11-03 MED ORDER — MIDAZOLAM HCL 2 MG/2ML IJ SOLN
INTRAMUSCULAR | Status: AC
  Filled 2023-11-03: qty 2

## 2023-11-03 MED ORDER — PROPOFOL 10 MG/ML IV BOLUS
INTRAVENOUS | Status: AC
  Filled 2023-11-03: qty 20

## 2023-11-03 MED ORDER — BUPIVACAINE HCL (PF) 0.5 % IJ SOLN
INTRAMUSCULAR | Status: DC | PRN
  Administered 2023-11-03: 25 mL via PERINEURAL

## 2023-11-03 SURGICAL SUPPLY — 47 items
BLADE MINI RND TIP GREEN BEAV (BLADE) IMPLANT
BLADE SURG 15 STRL LF DISP TIS (BLADE) ×4 IMPLANT
BNDG ELASTIC 3INX 5YD STR LF (GAUZE/BANDAGES/DRESSINGS) ×4 IMPLANT
BNDG ELASTIC 4INX 5YD STR LF (GAUZE/BANDAGES/DRESSINGS) ×2 IMPLANT
BNDG ELASTIC 6X10 VLCR STRL LF (GAUZE/BANDAGES/DRESSINGS) IMPLANT
BNDG ESMARK 4X9 LF (GAUZE/BANDAGES/DRESSINGS) ×2 IMPLANT
BNDG GAUZE DERMACEA FLUFF 4 (GAUZE/BANDAGES/DRESSINGS) ×2 IMPLANT
CHLORAPREP W/TINT 26 (MISCELLANEOUS) ×2 IMPLANT
CLIP TI MEDIUM 6 (CLIP) IMPLANT
CORD BIPOLAR FORCEPS 12FT (ELECTRODE) ×2 IMPLANT
COVER BACK TABLE 60X90IN (DRAPES) ×2 IMPLANT
COVER MAYO STAND STRL (DRAPES) ×2 IMPLANT
CUFF TOURN SGL QUICK 18X3 (MISCELLANEOUS) ×2 IMPLANT
CUFF TOURN SGL QUICK 18X4 (TOURNIQUET CUFF) ×2 IMPLANT
DRAPE EXTREMITY T 121X128X90 (DISPOSABLE) ×2 IMPLANT
DRAPE SURG 17X23 STRL (DRAPES) ×2 IMPLANT
GAUZE 4X4 16PLY ~~LOC~~+RFID DBL (SPONGE) IMPLANT
GAUZE PAD ABD 8X10 STRL (GAUZE/BANDAGES/DRESSINGS) ×2 IMPLANT
GAUZE SPONGE 4X4 12PLY STRL (GAUZE/BANDAGES/DRESSINGS) ×2 IMPLANT
GAUZE XEROFORM 1X8 LF (GAUZE/BANDAGES/DRESSINGS) ×2 IMPLANT
GLOVE BIO SURGEON STRL SZ7.5 (GLOVE) ×2 IMPLANT
GLOVE BIOGEL PI IND STRL 8 (GLOVE) ×2 IMPLANT
GLOVE BIOGEL PI IND STRL 8.5 (GLOVE) ×2 IMPLANT
GLOVE SURG ORTHO 8.0 STRL STRW (GLOVE) ×2 IMPLANT
GOWN STRL REUS W/ TWL LRG LVL3 (GOWN DISPOSABLE) ×2 IMPLANT
GOWN STRL REUS W/TWL XL LVL3 (GOWN DISPOSABLE) ×4 IMPLANT
NDL HYPO 25X1 1.5 SAFETY (NEEDLE) ×2 IMPLANT
NEEDLE HYPO 25X1 1.5 SAFETY (NEEDLE) ×2 IMPLANT
NS IRRIG 1000ML POUR BTL (IV SOLUTION) ×2 IMPLANT
PACK BASIN DAY SURGERY FS (CUSTOM PROCEDURE TRAY) ×2 IMPLANT
PAD CAST 3X4 CTTN HI CHSV (CAST SUPPLIES) ×2 IMPLANT
PAD CAST 4YDX4 CTTN HI CHSV (CAST SUPPLIES) ×2 IMPLANT
PAD CAST CTTN 4X4 STRL (SOFTGOODS) IMPLANT
PADDING CAST ABS COTTON 4X4 ST (CAST SUPPLIES) ×2 IMPLANT
PADDING CAST COTTON 6X4 STRL (CAST SUPPLIES) IMPLANT
SLEEVE SCD COMPRESS KNEE MED (STOCKING) ×2 IMPLANT
SPIKE FLUID TRANSFER (MISCELLANEOUS) IMPLANT
SPLINT PLASTER CAST FAST 5X30 (CAST SUPPLIES) IMPLANT
SPLINT PLASTER CAST XFAST 3X15 (CAST SUPPLIES) IMPLANT
STOCKINETTE 4X48 STRL (DRAPES) ×2 IMPLANT
SUT ETHILON 4 0 PS 2 18 (SUTURE) ×2 IMPLANT
SUT VIC AB 2-0 SH 27XBRD (SUTURE) ×2 IMPLANT
SUT VIC AB 4-0 PS2 18 (SUTURE) IMPLANT
SYR BULB EAR ULCER 3OZ GRN STR (SYRINGE) ×2 IMPLANT
SYR CONTROL 10ML LL (SYRINGE) ×2 IMPLANT
TOWEL GREEN STERILE FF (TOWEL DISPOSABLE) ×4 IMPLANT
UNDERPAD 30X36 HEAVY ABSORB (UNDERPADS AND DIAPERS) ×2 IMPLANT

## 2023-11-03 NOTE — H&P (Signed)
 Chad Pratt is an 73 y.o. male.   Chief Complaint: hand numbness HPI: 73 y.o. yo male with numbness and tingling left hand.  Positive nerve conduction studies. He wishes to have left carpal tunnel release and repeat ulnar nerve decompression at elbow with possible transposition.   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 No pertinent family history.  Social History:   reports that he has never smoked. He has never used smokeless tobacco. He reports current alcohol use. He reports that he does not use drugs.  Medications: Medications Prior to Admission  Medication Sig Dispense Refill   Apixaban (ELIQUIS PO) Take 5 mg by mouth in the morning and at bedtime.     Cyanocobalamin (B-12 PO) Take by mouth.     gabapentin (NEURONTIN) 300 MG capsule Take 300 mg by mouth 3 (three) times daily.     glucosamine-chondroitin 500-400 MG tablet Take 1 tablet by mouth 3 (three) times daily.      hydrochlorothiazide (MICROZIDE) 12.5 MG capsule Take 12.5 mg by mouth daily.     lisinopril (ZESTRIL) 20 MG tablet Take 20 mg by mouth daily.     metFORMIN (GLUCOPHAGE) 500 MG tablet Take by mouth 2 (two) times daily with a meal.     Multiple Vitamin (MULTIVITAMIN WITH MINERALS) TABS tablet Take 1 tablet by mouth daily.     simvastatin (ZOCOR) 20 MG tablet Take 20 mg by mouth daily.     VITAMIN D PO Take by mouth.     Metoprolol Succinate (TOPROL XL PO) Take 50 mg by mouth as needed (for breakthru tachycardia).      Results for orders placed or performed during the hospital encounter of 11/03/23 (from the past 48 hours)  Glucose, capillary     Status: None   Collection Time: 11/03/23 10:17 AM  Result Value Ref Range   Glucose-Capillary 97 70 - 99 mg/dL    Comment: Glucose reference range applies only to samples taken after fasting for at least 8 hours.    No results found.    Blood pressure (!) 183/88, pulse (!) 55, temperature 98.9 F (37.2 C), temperature source Oral, resp. rate 13, height 5\' 9"  (1.753 m), weight 117.3 kg, SpO2 98%.  General appearance: alert, cooperative, and appears stated age Head: Normocephalic, without obvious abnormality, atraumatic Neck: supple, symmetrical, trachea midline Extremities: Intact capillary refill  all digits.  Decreased sensation left hand.  +epl/fpl/io.  No wounds.  Skin: Skin color, texture, turgor normal. 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  Non operative and operative treatment options have been discussed with the patient and patient wishes to proceed with operative treatment. Risks, benefits, and alternatives of surgery have been discussed and the patient agrees with the plan of care.   Betha Loa 11/03/2023, 10:41 AM

## 2023-11-03 NOTE — Discharge Instructions (Addendum)
 [facility] Instructions Hand Surgery  Wound Care: Keep your hand elevated above the level of your heart.  Do not allow it to dangle by your side.  Keep the dressing dry and do not remove it unless your doctor advises you to do so.  He will usually change it at the time of your post-op visit.  Moving your fingers is advised to stimulate circulation but will depend on the site of your surgery.  If you have a splint applied, your doctor will advise you regarding movement.  Activity: Do not drive or operate machinery today.  Rest today and then you may return to your normal activity and work as indicated by your physician.  Diet:  Drink liquids today or eat a light diet.  You may resume a regular diet tomorrow.    General expectations: Pain for two to three days. Fingers may become slightly swollen.  Call your doctor if any of the following occur: Severe pain not relieved by pain medication. Elevated temperature. Dressing soaked with blood. Inability to move fingers. White or bluish color to fingers.    Post Anesthesia Home Care Instructions  Activity: Get plenty of rest for the remainder of the day. A responsible individual must stay with you for 24 hours following the procedure.  For the next 24 hours, DO NOT: -Drive a car -Advertising copywriter -Drink alcoholic beverages -Take any medication unless instructed by your physician -Make any legal decisions or sign important papers.  Meals: Start with liquid foods such as gelatin or soup. Progress to regular foods as tolerated. Avoid greasy, spicy, heavy foods. If nausea and/or vomiting occur, drink only clear liquids until the nausea and/or vomiting subsides. Call your physician if vomiting continues.  Special Instructions/Symptoms: Your throat may feel dry or sore from the anesthesia or the breathing tube placed in your throat during surgery. If this causes discomfort, gargle with warm salt water. The discomfort should disappear  within 24 hours.  If you had a scopolamine patch placed behind your ear for the management of post- operative nausea and/or vomiting:  1. The medication in the patch is effective for 72 hours, after which it should be removed.  Wrap patch in a tissue and discard in the trash. Wash hands thoroughly with soap and water. 2. You may remove the patch earlier than 72 hours if you experience unpleasant side effects which may include dry mouth, dizziness or visual disturbances. 3. Avoid touching the patch. Wash your hands with soap and water after contact with the patch.     Regional Anesthesia Blocks  1. You may not be able to move or feel the "blocked" extremity after a regional anesthetic block. This may last may last from 3-48 hours after placement, but it will go away. The length of time depends on the medication injected and your individual response to the medication. As the nerves start to wake up, you may experience tingling as the movement and feeling returns to your extremity. If the numbness and inability to move your extremity has not gone away after 48 hours, please call your surgeon.   2. The extremity that is blocked will need to be protected until the numbness is gone and the strength has returned. Because you cannot feel it, you will need to take extra care to avoid injury. Because it may be weak, you may have difficulty moving it or using it. You may not know what position it is in without looking at it while the block is in  effect.  3. For blocks in the legs and feet, returning to weight bearing and walking needs to be done carefully. You will need to wait until the numbness is entirely gone and the strength has returned. You should be able to move your leg and foot normally before you try and bear weight or walk. You will need someone to be with you when you first try to ensure you do not fall and possibly risk injury.  4. Bruising and tenderness at the needle site are common side  effects and will resolve in a few days.  5. Persistent numbness or new problems with movement should be communicated to the surgeon or the [facility] [phone] [facility] [phone]).

## 2023-11-03 NOTE — Anesthesia Procedure Notes (Signed)
 Procedure Name: MAC Date/Time: 11/03/2023 12:33 PM  Performed by: Francie Massing, CRNAPre-anesthesia Checklist: Patient identified, Emergency Drugs available, Suction available, Patient being monitored and Timeout performed Oxygen Delivery Method: Simple face mask

## 2023-11-03 NOTE — Anesthesia Procedure Notes (Signed)
 Anesthesia Regional Block: Supraclavicular block   Pre-Anesthetic Checklist: , timeout performed,  Correct Patient, Correct Site, Correct Laterality,  Correct Procedure, Correct Position, site marked,  Risks and benefits discussed,  Surgical consent,  Pre-op evaluation,  At surgeon's request and post-op pain management  Laterality: Upper and Right  Prep: chloraprep       Needles:  Injection technique: Single-shot  Needle Type: Echogenic Needle     Needle Length: 5cm  Needle Gauge: 21     Additional Needles:   Procedures:,,,, ultrasound used (permanent image in chart),,     Nerve Stimulator or Paresthesia:   Additional Responses:  Block tested.  Patient tolerated procedure well Narrative:  Start time: 11/03/2023 10:35 AM End time: 11/03/2023 10:40 AM Injection made incrementally with aspirations every 5 mL.  Performed by: Personally  Anesthesiologist: Trevor Iha, MD  Additional Notes: Block tested. Patient tolerated procedure well.

## 2023-11-03 NOTE — Op Note (Signed)
 I assisted Surgeons and Role:    * Betha Loa, MD - Primary    * Cindee Salt, MD - Assisting on the Procedure(s): LEFT CARPAL TUNNEL RELEASE LEFT ULNAR NERVE DECOMPRESSION AT ELBOW, POSSIBLE TRANSPOSITION on 11/03/2023.  I provided assistance on this case as follows: Set up, approach, identification median nerve, decompression of the median nerve at the wrist, closure of the wound. Approach, retraction for identification of the ulnar nerve at the elbow, retraction for use of the ulnar nerve proximally, retraction for release of the ulnar nerve distally, to the elbow to check for subluxation, formation of retention sling, closure of a wound and application of the dressing.  Electronically signed by: Cindee Salt, MD Date: 11/03/2023 Time: 1:29 PM

## 2023-11-03 NOTE — Progress Notes (Signed)
 Assisted Dr. Richardson Landry with left, supraclavicular, ultrasound guided block. Side rails up, monitors on throughout procedure. See vital signs in flow sheet. Tolerated Procedure well.

## 2023-11-03 NOTE — Anesthesia Postprocedure Evaluation (Signed)
 Anesthesia Post Note  Patient: Chad Pratt  Procedure(s) Performed: LEFT CARPAL TUNNEL RELEASE (Left: Wrist) LEFT ULNAR NERVE DECOMPRESSION AT ELBOW, POSSIBLE TRANSPOSITION (Left)     Patient location during evaluation: PACU Anesthesia Type: Regional Level of consciousness: awake and alert Pain management: pain level controlled Vital Signs Assessment: post-procedure vital signs reviewed and stable Respiratory status: spontaneous breathing, nonlabored ventilation, respiratory function stable and patient connected to nasal cannula oxygen Cardiovascular status: blood pressure returned to baseline and stable Postop Assessment: no apparent nausea or vomiting Anesthetic complications: no  No notable events documented.  Last Vitals:  Vitals:   11/03/23 1411 11/03/23 1430  BP: 117/85 132/83  Pulse: (!) 58 62  Resp: (!) 9 20  Temp:  (!) 36.2 C  SpO2: 93% 95%    Last Pain:  Vitals:   11/03/23 1430  TempSrc: Temporal  PainSc: 2                  Trevor Iha

## 2023-11-03 NOTE — Op Note (Signed)
 11/03/2023 [facility]                              OPERATIVE REPORT   PREOPERATIVE DIAGNOSIS: 1.  Left carpal tunnel syndrome 2.  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  Left carpal tunnel syndrome 2.  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  Left carpal tunnel release 2.  Left ulnar nerve decompression at the elbow  SURGEON:  Betha Loa, MD  ASSISTANT: Cindee Salt, MD.  ANESTHESIA: Regional with sedation  IV FLUIDS:  Per anesthesia flow sheet  ESTIMATED BLOOD LOSS:  Minimal  COMPLICATIONS:  None  SPECIMENS:  None  TOURNIQUET TIME:    Total Tourniquet Time Documented: Upper Arm (Left) - 40 minutes Total: Upper Arm (Left) - 40 minutes   DISPOSITION:  Stable to PACU  LOCATION: [facility]  INDICATIONS:  73 y.o. yo male with numbness and tingling left hand.  Positive nerve conduction studies. He wishes to proceed with left carpal tunnel release and repeat release of ulnar nerve at the elbow with possible transposition.  Risks, benefits and alternatives of surgery were discussed including the risk of blood loss; infection; damage to nerves, vessels, tendons, ligaments, bone; failure of surgery; need for additional surgery; complications with wound healing; continued pain; recurrence of carpal tunnel syndrome; and damage to motor branch. He voiced understanding of these risks and elected to proceed.   OPERATIVE COURSE:  After being identified preoperatively by myself, the patient and I agreed upon the procedure and site of procedure.  The surgical site was marked.  Surgical consent had been signed.  He was given IV Ancef as preoperative antibiotic prophylaxis.  He was transferred to the operating room and placed on the operating room table in supine position with the left upper extremity on an armboard.  Sedation was induced by the anesthesiologist. and A regional block had been performed by  anesthesia in preoperative holding.    Left upper extremity was prepped and draped in normal sterile orthopaedic fashion.  A surgical pause was performed between the surgeons, anesthesia, and operating room staff, and all were in agreement as to the patient, procedure, and site of procedure.  Tourniquet at the proximal aspect of the extremity was inflated to 250 mmHg after exsanguination of the arm with an Esmarch bandage  Incision was made over the transverse carpal ligament and carried into the subcutaneous tissues by spreading technique.  Bipolar electrocautery was used to obtain hemostasis.  The palmar fascia was sharply incised.  The transverse carpal ligament was identified.  The fascia distal to the ligament was opened.  Retractor was placed and the flexor tendons were identified.  The flexor tendon to the ring finger was identified and retracted radially.  The transverse carpal ligament was then incised from distal to proximal under direct visualization.  Scissors were used to split the distal aspect of the volar antebrachial fascia.  A finger was placed into the wound to ensure complete decompression, which was the case.  The nerve was examined.  It was flattened and hyperemic.  The motor branch was identified and was intact.  The wound was copiously irrigated with sterile saline.  It was then closed with 4-0 nylon in a horizontal mattress fashion.  This was then made medial side of the elbow incorporating previous incision.  This was carried in subcutaneous tissues by spreading technique.  Incision  was extended proximally and distally to aid in visualization.  The ulnar nerve was identified proximal to Osborne's ligament.  Was decompressed proximally.  Investing fascia surrounding the nerve was released.  Good decompression was achieved.  There was Rie formation of Osborne's ligament and scar over top of the nerve at the medial side of the elbow.  The nerve was protected and the scar reformed ligament  released.  Distally there was fascia over the FCU muscle which was released and the FCU muscle was spread.  The investing fascia over the nerve was tight.  This was released with the scissors under direct visualization while protecting the nerve with the KMI.  Motor branches to the FCU muscle were preserved.  There was adherence of the nerve to the scar and reformed Osborne's ligament at the elbow.  This was released.  The elbow was flexed and the nerve did not subluxate out of the groove.  Wound was copiously irrigated with sterile saline.  The anterior leaflet of Osborne's ligament was repaired to the posterior subcutaneous tissues to provide a bolster against subluxation.  This was done with 2-0 Vicryl suture in a figure-of-eight fashion.  Inverted interrupted 4-0 Vicryl sutures were placed in subcutaneous tissues and skin with 4-0 nylon mattress fashion.  The wounds were dressed with sterile Xeroform, 4x4s, an ABD, and wrapped with Kerlix and an Ace bandage.  Tourniquet was deflated at 40 minutes.  Fingertips were pink with brisk capillary refill after deflation of the tourniquet.  Operative drapes were broken down.  The patient was awoken from anesthesia safely.  He was transferred back to stretcher and taken to the PACU in stable condition.  I will see him back in the office in 1 week for postoperative followup.  I will give him a prescription for Norco 5/325 1-2 tabs PO q6 hours prn pain, dispense # 20.    Betha Loa, MD Electronically signed, 11/03/23

## 2023-11-03 NOTE — Transfer of Care (Signed)
 Immediate Anesthesia Transfer of Care Note  Patient: Chad Pratt  Procedure(s) Performed: Procedure(s) (LRB): LEFT CARPAL TUNNEL RELEASE (Left) LEFT ULNAR NERVE DECOMPRESSION AT ELBOW, POSSIBLE TRANSPOSITION (Left)  Patient Location: PACU  Anesthesia Type: MAC  Level of Consciousness: awake, alert , oriented and patient cooperative  Airway & Oxygen Therapy: Patient Spontanous Breathing Room Air Post-op Assessment: Report given to PACU RN and Post -op Vital signs reviewed and stable  Post vital signs: Reviewed and stable  Complications: No apparent anesthesia complications Last Vitals:  Vitals Value Taken Time  BP 121/73 11/03/23 1333  Temp    Pulse 43 11/03/23 1338  Resp 18 11/03/23 1338  SpO2 93 % 11/03/23 1338  Vitals shown include unfiled device data.  Last Pain:  Vitals:   11/03/23 1014  TempSrc: Oral  PainSc: 0-No pain         Complications: No notable events documented.

## 2023-11-04 ENCOUNTER — Encounter (HOSPITAL_BASED_OUTPATIENT_CLINIC_OR_DEPARTMENT_OTHER): Payer: Self-pay | Admitting: Orthopedic Surgery

## 2023-11-30 ENCOUNTER — Other Ambulatory Visit: Payer: Self-pay | Admitting: Medical Genetics

## 2023-12-01 ENCOUNTER — Other Ambulatory Visit (HOSPITAL_COMMUNITY)
Admission: RE | Admit: 2023-12-01 | Discharge: 2023-12-01 | Disposition: A | Discharge status: Home / Self Care | Payer: Self-pay | Source: Ambulatory Visit | Attending: Medical Genetics | Admitting: Medical Genetics

## 2023-12-26 ENCOUNTER — Telehealth: Payer: Self-pay | Admitting: Medical Genetics

## 2023-12-26 DIAGNOSIS — Z1371 Encounter for nonprocreative screening for genetic disease carrier status: Secondary | ICD-10-CM | POA: Insufficient documentation

## 2023-12-26 LAB — GENECONNECT MOLECULAR SCREEN: Genetic Analysis Overall Interpretation: POSITIVE — AB

## 2023-12-26 NOTE — Telephone Encounter (Signed)
 [facility] Positive Result Note 12/26/2023 4:59 PM  FIRST ATTEMPT: Confirmed I was speaking with Chad Pratt [phone] by using name and DOB. Informed participant the reason for this call is to provide results for the above study. Results revealed Hereditary Breast and Ovarian Syndrome. Genetic counseling was offered and participant declined as he was aware of this result previously and has already received genetic counseling. All questions were answered, and participant was thanked for their time and support of the above study. Participant was encouraged to contact [facility] if they have any further questions or concerns.
